# Patient Record
Sex: Male | Born: 1995 | Race: Black or African American | Hispanic: No | Marital: Single | Smoking: Current some day smoker
Health system: Southern US, Community
[De-identification: ages and names within clinical notes are randomized; demographics above are authoritative.]

## PROBLEM LIST (undated history)

## (undated) DIAGNOSIS — S42309A Unspecified fracture of shaft of humerus, unspecified arm, initial encounter for closed fracture: Secondary | ICD-10-CM

---

## 2016-10-02 ENCOUNTER — Emergency Department (HOSPITAL_COMMUNITY)
Admission: EM | Admit: 2016-10-02 | Discharge: 2016-10-03 | Disposition: A | Discharge status: Home / Self Care | Payer: Self-pay | Attending: Emergency Medicine | Admitting: Emergency Medicine

## 2016-10-02 ENCOUNTER — Emergency Department (HOSPITAL_COMMUNITY): Payer: Self-pay

## 2016-10-02 ENCOUNTER — Encounter (HOSPITAL_COMMUNITY): Payer: Self-pay

## 2016-10-02 DIAGNOSIS — S42292A Other displaced fracture of upper end of left humerus, initial encounter for closed fracture: Secondary | ICD-10-CM | POA: Insufficient documentation

## 2016-10-02 DIAGNOSIS — Y9289 Other specified places as the place of occurrence of the external cause: Secondary | ICD-10-CM | POA: Insufficient documentation

## 2016-10-02 DIAGNOSIS — Y9302 Activity, running: Secondary | ICD-10-CM | POA: Insufficient documentation

## 2016-10-02 DIAGNOSIS — W0110XA Fall on same level from slipping, tripping and stumbling with subsequent striking against unspecified object, initial encounter: Secondary | ICD-10-CM | POA: Insufficient documentation

## 2016-10-02 DIAGNOSIS — Y999 Unspecified external cause status: Secondary | ICD-10-CM | POA: Insufficient documentation

## 2016-10-02 MED ORDER — OXYCODONE-ACETAMINOPHEN 5-325 MG PO TABS
1.0000 | ORAL_TABLET | Freq: Once | ORAL | Status: AC
  Administered 2016-10-02: 1 via ORAL
  Filled 2016-10-02: qty 1

## 2016-10-02 NOTE — ED Triage Notes (Signed)
Pt reports he was running, tripped over his flip flops, fell and landed on concrete. He fell forward and landed on his left arm. He reports pain to left shoulder and has abrasion to left lateral forearm and upper lip. No LOC.

## 2016-10-02 NOTE — ED Provider Notes (Signed)
[facility]-EMERGENCY DEPT Provider Note   CSN: [id number] Arrival date & time: 10/02/16  2204     History   Chief Complaint Chief Complaint  Patient presents with  . Shoulder Injury    HPI Edgar Santiago is a 21 y.o. male.  HPI   Patient is a 21 year old male with no pertinent past medical history presents the ED status post mechanical fall that occurred earlier this evening. Patient reports he was running while he was "playing around" and states he tripped over his flip flops resulting in him landing on his left shoulder on the concrete ground. He reports scraping his upper lip on the ground as he fell, denies LOC. Endorses associated pain to his left shoulder and upper arm which is significantly worsened with any movement. Denies headache, dizziness, neck pain, back pain, chest pain, difficulty breathing, leg pain, numbness, weakness. Denies taking any medications prior to arrival.  History reviewed. No pertinent past medical history.  There are no active problems to display for this patient.   History reviewed. No pertinent surgical history.     Home Medications    Prior to Admission medications   Medication Sig Start Date End Date Taking? Authorizing Provider  ibuprofen (ADVIL,MOTRIN) 600 MG tablet Take 1 tablet (600 mg total) by mouth every 6 (six) hours as needed. 10/03/16   Barrett HenleNadeau, Breia Ocampo Elizabeth, PA-C  oxyCODONE-acetaminophen (PERCOCET/ROXICET) 5-325 MG tablet Take 1-2 tablets by mouth every 4 (four) hours as needed for severe pain. 10/03/16   Barrett HenleNadeau, Mirielle Byrum Elizabeth, PA-C    Family History No family history on file.  Social History Social History  Substance Use Topics  . Smoking status: Never Smoker  . Smokeless tobacco: Never Used  . Alcohol use Yes     Allergies   Patient has no allergy information on record.   Review of Systems Review of Systems  Musculoskeletal: Positive for arthralgias (left shoulder).  Skin: Positive for wound (lip abrasion).  All  other systems reviewed and are negative.    Physical Exam Updated Vital Signs BP (!) 142/96 (BP Location: Right Arm)   Pulse 76   Temp 99.1 F (37.3 C) (Oral)   Resp 16   SpO2 100%   Physical Exam  Constitutional: He is oriented to person, place, and time. He appears well-developed and well-nourished. No distress.  HENT:  Head: Normocephalic. Head is with abrasion. Head is without raccoon's eyes, without Battle's sign and without laceration.    Right Ear: Tympanic membrane normal. No hemotympanum.  Left Ear: Tympanic membrane normal. No hemotympanum.  Nose: Nose normal. No sinus tenderness, nasal deformity, septal deviation or nasal septal hematoma. No epistaxis.  Mouth/Throat: Uvula is midline, oropharynx is clear and moist and mucous membranes are normal. No oropharyngeal exudate, posterior oropharyngeal edema, posterior oropharyngeal erythema or tonsillar abscesses.  Eyes: Conjunctivae and EOM are normal. Pupils are equal, round, and reactive to light. Right eye exhibits no discharge. Left eye exhibits no discharge. No scleral icterus.  Neck: Normal range of motion. Neck supple.  Cardiovascular: Normal rate, regular rhythm, normal heart sounds and intact distal pulses.   Pulmonary/Chest: Effort normal and breath sounds normal. No respiratory distress. He has no wheezes. He has no rales. He exhibits no tenderness.  Abdominal: Soft. Bowel sounds are normal. He exhibits no distension and no mass. There is no tenderness. There is no rebound and no guarding. No hernia.  Musculoskeletal: He exhibits tenderness and deformity. He exhibits no edema.       Left shoulder: He exhibits  decreased range of motion, tenderness, bony tenderness and deformity. He exhibits no effusion, no crepitus, no laceration and normal pulse.  Deformity present to left shoulder with TTP over anterior and posterior shoulder and proximal humerus. Dec. ROM of left shoulder and elbow due to reports pain, FROM of left  forearm, wrist and hand with 5/5 strength.   No cervical, thoracic, or lumbar spine midline TTP. Full ROM of right upper and lower extremities with 5/5 strength.   2+ radial and PT pulses. Sensation grossly intact.   Neurological: He is alert and oriented to person, place, and time. He has normal strength. No cranial nerve deficit or sensory deficit. Coordination and gait normal.  Skin: Skin is warm and dry. He is not diaphoretic.  Nursing note and vitals reviewed.    ED Treatments / Results  Labs (all labs ordered are listed, but only abnormal results are displayed) Labs Reviewed - No data to display  EKG  EKG Interpretation None       Radiology Ct Shoulder Left Wo Contrast  Result Date: 10/03/2016 CLINICAL DATA:  Patient fell landing on concrete. Left shoulder pain. EXAM: CT OF THE UPPER LEFT EXTREMITY WITHOUT CONTRAST TECHNIQUE: Multidetector CT imaging of the upper left extremity was performed according to the standard protocol. COMPARISON:  10/02/2016 radiographs of the left shoulder. FINDINGS: Bones/Joint/Cartilage Acute, closed, comminuted fracture involving the surgical neck of the left humerus with nearly 1 shaft with posterior displacement and volar angulation of the distal fracture fragment. No malalignment of the glenohumeral or AC joints. Ligaments Suboptimally assessed by CT. Muscles and Tendons No intramuscular hemorrhage. Soft tissues Soft tissue edema overlies the fracture. The adjacent ribs and lung are unremarkable. IMPRESSION: Comminuted, displaced and volar angulated closed fracture of the proximal left humerus involving the surgical neck with greater than 1 cm displacement consistent with a Neer category 2 part fracture. No shoulder dislocation. AC joint is maintained. Electronically Signed   By: Tollie Eth M.D.   On: 10/03/2016 00:24   Dg Shoulder Left  Result Date: 10/02/2016 CLINICAL DATA:  Trip and fall injury while running tonight EXAM: LEFT SHOULDER - 2+ VIEW  COMPARISON:  None. FINDINGS: There is a mildly comminuted proximal humeral fracture full shaft width posterior displacement. No dislocation. IMPRESSION: Displaced proximal left humeral fracture. Electronically Signed   By: Ellery Plunk M.D.   On: 10/02/2016 22:45    Procedures Procedures (including critical care time)  Medications Ordered in ED Medications  oxyCODONE-acetaminophen (PERCOCET/ROXICET) 5-325 MG per tablet 1 tablet (1 tablet Oral Given 10/02/16 2302)  oxyCODONE-acetaminophen (PERCOCET/ROXICET) 5-325 MG per tablet 1 tablet (1 tablet Oral Given 10/03/16 0048)     Initial Impression / Assessment and Plan / ED Course  I have reviewed the triage vital signs and the nursing notes.  Pertinent labs & imaging results that were available during my care of the patient were reviewed by me and considered in my medical decision making (see chart for details).     Patient presents with left shoulder pain after tripping while he was running and landing on his left shoulder. VSS. Exam revealed deformity to left shoulder with swelling and diffuse tenderness and decreased range of motion due to pain. Left upper extremity otherwise neurovascularly intact. No evidence of head injury. Patient given pain meds in the ED. Left shoulder x-ray revealed displaced proximal left humeral fracture. Due to significant displacement on x-ray, consulted ortho. Dr. Aundria Rud advised to order CT, place pt in sling and follow up next week in  their office. Discussed results and plan with patient. Patient discharged home with pain meds and symptomatically treatment. Discussed return precautions.  Final Clinical Impressions(s) / ED Diagnoses   Final diagnoses:  Other closed displaced fracture of proximal end of left humerus, initial encounter    New Prescriptions Discharge Medication List as of 10/03/2016 12:44 AM    START taking these medications   Details  ibuprofen (ADVIL,MOTRIN) 600 MG tablet Take 1 tablet (600  mg total) by mouth every 6 (six) hours as needed., Starting Sat 10/03/2016, Print    oxyCODONE-acetaminophen (PERCOCET/ROXICET) 5-325 MG tablet Take 1-2 tablets by mouth every 4 (four) hours as needed for severe pain., Starting Sat 10/03/2016, Print         Barrett Henle, [place] 10/03/16 4098    Ward, Layla Maw, DO 10/03/16 [phone]

## 2016-10-03 ENCOUNTER — Emergency Department (HOSPITAL_COMMUNITY): Payer: Self-pay

## 2016-10-03 MED ORDER — IBUPROFEN 600 MG PO TABS
600.0000 mg | ORAL_TABLET | Freq: Four times a day (QID) | ORAL | 0 refills | Status: AC | PRN
Start: 2016-10-03 — End: ?

## 2016-10-03 MED ORDER — OXYCODONE-ACETAMINOPHEN 5-325 MG PO TABS
1.0000 | ORAL_TABLET | Freq: Once | ORAL | Status: AC
  Administered 2016-10-03: 1 via ORAL
  Filled 2016-10-03: qty 1

## 2016-10-03 MED ORDER — OXYCODONE-ACETAMINOPHEN 5-325 MG PO TABS: 1.0000 | ORAL_TABLET | ORAL | 0 refills | Status: AC | PRN

## 2016-10-03 NOTE — Discharge Instructions (Signed)
Take your pain medications as prescribed as needed for pain relief. I also recommend applying ice to affected area for 15-20 minutes 3-4 times daily. Continue wearing your arm sling all day (except you may remove for bathing) until you follow up with orthopedics.  Call the orthopedic office listed below on Monday morning to schedule a follow-up appointment for reevaluation next week for further management of her humerus fracture. Please return to the Emergency Department if symptoms worsen or new onset of fever, redness, swelling, numbness, weakness, decreased range of motion.

## 2016-10-03 NOTE — ED Notes (Signed)
Patient transported to CT 

## 2016-10-14 ENCOUNTER — Encounter (HOSPITAL_COMMUNITY): Payer: Self-pay | Admitting: *Deleted

## 2016-10-14 NOTE — Progress Notes (Signed)
Pt denies SOB, chest pain, and being under the care of a cardiologist. Pt denies having a stress test, echo and cardiac cath. Pt denies having and EKG and chest x ray within the last year. Pt denies having recent labs. Pt made  aware to stop taking  Aspirin,vitamins, fish oil and herbal medications. Do not take any NSAIDs ie: Ibuprofen, Advil, Naproxen, BC and Goody Powder or any medication containing Aspirin. Pt verbalized understanding of all pre-op instructions.

## 2016-10-15 ENCOUNTER — Ambulatory Visit (HOSPITAL_COMMUNITY): Payer: BLUE CROSS/BLUE SHIELD

## 2016-10-15 ENCOUNTER — Ambulatory Visit (HOSPITAL_COMMUNITY)
Admission: RE | Admit: 2016-10-15 | Discharge: 2016-10-15 | Disposition: A | Discharge status: Home / Self Care | Payer: BLUE CROSS/BLUE SHIELD | Source: Ambulatory Visit | Attending: Orthopedic Surgery | Admitting: Orthopedic Surgery

## 2016-10-15 ENCOUNTER — Ambulatory Visit (HOSPITAL_COMMUNITY): Payer: BLUE CROSS/BLUE SHIELD | Admitting: Anesthesiology

## 2016-10-15 ENCOUNTER — Encounter (HOSPITAL_COMMUNITY): Payer: Self-pay | Admitting: Urology

## 2016-10-15 ENCOUNTER — Encounter (HOSPITAL_COMMUNITY)
Admission: RE | Disposition: A | Discharge status: Home / Self Care | Payer: Self-pay | Source: Ambulatory Visit | Attending: Orthopedic Surgery

## 2016-10-15 DIAGNOSIS — Z419 Encounter for procedure for purposes other than remedying health state, unspecified: Secondary | ICD-10-CM

## 2016-10-15 DIAGNOSIS — F17210 Nicotine dependence, cigarettes, uncomplicated: Secondary | ICD-10-CM | POA: Insufficient documentation

## 2016-10-15 DIAGNOSIS — W1839XA Other fall on same level, initial encounter: Secondary | ICD-10-CM | POA: Insufficient documentation

## 2016-10-15 DIAGNOSIS — S42265A Nondisplaced fracture of lesser tuberosity of left humerus, initial encounter for closed fracture: Secondary | ICD-10-CM | POA: Diagnosis not present

## 2016-10-15 DIAGNOSIS — S42202A Unspecified fracture of upper end of left humerus, initial encounter for closed fracture: Secondary | ICD-10-CM | POA: Diagnosis present

## 2016-10-15 HISTORY — PX: ORIF HUMERUS FRACTURE: SHX2126

## 2016-10-15 HISTORY — DX: Unspecified fracture of shaft of humerus, unspecified arm, initial encounter for closed fracture: S42.309A

## 2016-10-15 SURGERY — OPEN REDUCTION INTERNAL FIXATION (ORIF) PROXIMAL HUMERUS FRACTURE
Anesthesia: Regional | Laterality: Left

## 2016-10-15 MED ORDER — 0.9 % SODIUM CHLORIDE (POUR BTL) OPTIME
TOPICAL | Status: DC | PRN
  Administered 2016-10-15: 1000 mL

## 2016-10-15 MED ORDER — ROCURONIUM BROMIDE 100 MG/10ML IV SOLN
INTRAVENOUS | Status: DC | PRN
  Administered 2016-10-15 (×2): 50 mg via INTRAVENOUS

## 2016-10-15 MED ORDER — KETOROLAC TROMETHAMINE 30 MG/ML IJ SOLN
INTRAMUSCULAR | Status: DC | PRN
  Administered 2016-10-15: 30 mg via INTRAVENOUS

## 2016-10-15 MED ORDER — ONDANSETRON HCL 4 MG/2ML IJ SOLN
INTRAMUSCULAR | Status: AC
  Filled 2016-10-15: qty 2

## 2016-10-15 MED ORDER — SUGAMMADEX SODIUM 200 MG/2ML IV SOLN
INTRAVENOUS | Status: AC
  Filled 2016-10-15: qty 2

## 2016-10-15 MED ORDER — CHLORHEXIDINE GLUCONATE 4 % EX LIQD: 60.0000 mL | Freq: Once | CUTANEOUS | Status: DC

## 2016-10-15 MED ORDER — OXYCODONE HCL 5 MG PO TABS: 5.0000 mg | ORAL_TABLET | Freq: Once | ORAL | Status: DC | PRN

## 2016-10-15 MED ORDER — PROPOFOL 10 MG/ML IV BOLUS
INTRAVENOUS | Status: DC | PRN
Start: 2016-10-15 — End: 2016-10-15
  Administered 2016-10-15: 180 mg via INTRAVENOUS

## 2016-10-15 MED ORDER — BUPIVACAINE-EPINEPHRINE (PF) 0.5% -1:200000 IJ SOLN
INTRAMUSCULAR | Status: DC | PRN
  Administered 2016-10-15: 30 mL via PERINEURAL

## 2016-10-15 MED ORDER — ASPIRIN EC 325 MG PO TBEC: 325.0000 mg | DELAYED_RELEASE_TABLET | Freq: Every day | ORAL | 0 refills | Status: AC

## 2016-10-15 MED ORDER — ONDANSETRON 4 MG PO TBDP: 4.0000 mg | ORAL_TABLET | Freq: Three times a day (TID) | ORAL | 0 refills | Status: AC | PRN

## 2016-10-15 MED ORDER — PROPOFOL 10 MG/ML IV BOLUS
INTRAVENOUS | Status: AC
  Filled 2016-10-15: qty 20

## 2016-10-15 MED ORDER — METHOCARBAMOL 500 MG PO TABS: 500.0000 mg | ORAL_TABLET | Freq: Four times a day (QID) | ORAL | 0 refills | Status: AC | PRN

## 2016-10-15 MED ORDER — EPHEDRINE 5 MG/ML INJ
INTRAVENOUS | Status: AC
  Filled 2016-10-15: qty 10

## 2016-10-15 MED ORDER — LIDOCAINE 2% (20 MG/ML) 5 ML SYRINGE
INTRAMUSCULAR | Status: AC
  Filled 2016-10-15: qty 5

## 2016-10-15 MED ORDER — HYDROMORPHONE HCL 1 MG/ML IJ SOLN: 0.2500 mg | INTRAMUSCULAR | Status: DC | PRN

## 2016-10-15 MED ORDER — FENTANYL CITRATE (PF) 100 MCG/2ML IJ SOLN
INTRAMUSCULAR | Status: DC | PRN
  Administered 2016-10-15: 100 ug via INTRAVENOUS
  Administered 2016-10-15: 50 ug via INTRAVENOUS

## 2016-10-15 MED ORDER — ROCURONIUM BROMIDE 10 MG/ML (PF) SYRINGE
PREFILLED_SYRINGE | INTRAVENOUS | Status: AC
  Filled 2016-10-15: qty 10

## 2016-10-15 MED ORDER — DEXTROSE 5 % IV SOLN
INTRAVENOUS | Status: DC | PRN
  Administered 2016-10-15: 15 ug/min via INTRAVENOUS

## 2016-10-15 MED ORDER — KETOROLAC TROMETHAMINE 30 MG/ML IJ SOLN
INTRAMUSCULAR | Status: AC
  Filled 2016-10-15: qty 1

## 2016-10-15 MED ORDER — OXYCODONE HCL 5 MG/5ML PO SOLN: 5.0000 mg | Freq: Once | ORAL | Status: DC | PRN

## 2016-10-15 MED ORDER — LACTATED RINGERS IV SOLN
INTRAVENOUS | Status: DC | PRN
  Administered 2016-10-15: 07:00:00 via INTRAVENOUS

## 2016-10-15 MED ORDER — MIDAZOLAM HCL 2 MG/2ML IJ SOLN
INTRAMUSCULAR | Status: AC
  Filled 2016-10-15: qty 2

## 2016-10-15 MED ORDER — CEFAZOLIN SODIUM-DEXTROSE 2-4 GM/100ML-% IV SOLN
2.0000 g | INTRAVENOUS | Status: AC
  Administered 2016-10-15: 2 g via INTRAVENOUS

## 2016-10-15 MED ORDER — MIDAZOLAM HCL 5 MG/5ML IJ SOLN
INTRAMUSCULAR | Status: DC | PRN
  Administered 2016-10-15: 2 mg via INTRAVENOUS

## 2016-10-15 MED ORDER — OXYCODONE HCL 5 MG PO TABS: 5.0000 mg | ORAL_TABLET | ORAL | 0 refills | Status: AC | PRN

## 2016-10-15 MED ORDER — CEFAZOLIN SODIUM-DEXTROSE 2-4 GM/100ML-% IV SOLN
INTRAVENOUS | Status: AC
  Filled 2016-10-15: qty 100

## 2016-10-15 MED ORDER — FENTANYL CITRATE (PF) 250 MCG/5ML IJ SOLN
INTRAMUSCULAR | Status: AC
  Filled 2016-10-15: qty 5

## 2016-10-15 MED ORDER — EPHEDRINE SULFATE-NACL 50-0.9 MG/10ML-% IV SOSY
PREFILLED_SYRINGE | INTRAVENOUS | Status: DC | PRN
  Administered 2016-10-15 (×2): 5 mg via INTRAVENOUS

## 2016-10-15 MED ORDER — LIDOCAINE HCL (CARDIAC) 20 MG/ML IV SOLN
INTRAVENOUS | Status: DC | PRN
  Administered 2016-10-15: 60 mg via INTRAVENOUS

## 2016-10-15 MED ORDER — SUGAMMADEX SODIUM 200 MG/2ML IV SOLN
INTRAVENOUS | Status: DC | PRN
  Administered 2016-10-15: 200 mg via INTRAVENOUS

## 2016-10-15 SURGICAL SUPPLY — 83 items
BENZOIN TINCTURE PRP APPL 2/3 (GAUZE/BANDAGES/DRESSINGS) IMPLANT
BIT DRILL 3.2 (BIT) ×2
BIT DRILL 3.2XCALB NS DISP (BIT) ×1 IMPLANT
BIT DRILL CALIBRATED 2.7 (BIT) ×2 IMPLANT
BIT DRILL CALIBRATED 2.7MM (BIT) ×1
BIT DRL 3.2XCALB NS DISP (BIT) ×1
BLADE CLIPPER SURG (BLADE) IMPLANT
CLOSURE STERI-STRIP 1/2X4 (GAUZE/BANDAGES/DRESSINGS) ×1
CLOSURE WOUND 1/2 X4 (GAUZE/BANDAGES/DRESSINGS)
CLSR STERI-STRIP ANTIMIC 1/2X4 (GAUZE/BANDAGES/DRESSINGS) ×2 IMPLANT
COVER SURGICAL LIGHT HANDLE (MISCELLANEOUS) ×3 IMPLANT
DECANTER SPIKE VIAL GLASS SM (MISCELLANEOUS) IMPLANT
DRAPE IMP U-DRAPE 54X76 (DRAPES) ×3 IMPLANT
DRAPE U-SHAPE 47X51 STRL (DRAPES) ×3 IMPLANT
DRSG ADAPTIC 3X8 NADH LF (GAUZE/BANDAGES/DRESSINGS) ×3 IMPLANT
DRSG MEPILEX BORDER 4X8 (GAUZE/BANDAGES/DRESSINGS) ×3 IMPLANT
DRSG PAD ABDOMINAL 8X10 ST (GAUZE/BANDAGES/DRESSINGS) ×6 IMPLANT
DURAPREP 26ML APPLICATOR (WOUND CARE) ×3 IMPLANT
ELECT CAUTERY BLADE 6.4 (BLADE) ×3 IMPLANT
ELECT REM PT RETURN 9FT ADLT (ELECTROSURGICAL) ×3
ELECTRODE REM PT RTRN 9FT ADLT (ELECTROSURGICAL) ×1 IMPLANT
EVACUATOR 1/8 PVC DRAIN (DRAIN) IMPLANT
GAUZE SPONGE 4X4 12PLY STRL (GAUZE/BANDAGES/DRESSINGS) ×3 IMPLANT
GLOVE BIO SURGEON STRL SZ7.5 (GLOVE) ×3 IMPLANT
GLOVE BIO SURGEON STRL SZ8 (GLOVE) ×12 IMPLANT
GLOVE BIOGEL PI IND STRL 8 (GLOVE) ×1 IMPLANT
GLOVE BIOGEL PI INDICATOR 8 (GLOVE) ×2
GLOVE SURG SS PI 7.5 STRL IVOR (GLOVE) ×3 IMPLANT
GOWN STRL REUS W/ TWL LRG LVL3 (GOWN DISPOSABLE) ×1 IMPLANT
GOWN STRL REUS W/ TWL XL LVL3 (GOWN DISPOSABLE) ×3 IMPLANT
GOWN STRL REUS W/TWL LRG LVL3 (GOWN DISPOSABLE) ×2
GOWN STRL REUS W/TWL XL LVL3 (GOWN DISPOSABLE) ×6
K-WIRE 2X5 SS THRDED S3 (WIRE) ×6
KIT BASIN OR (CUSTOM PROCEDURE TRAY) ×3 IMPLANT
KIT ROOM TURNOVER OR (KITS) ×3 IMPLANT
KWIRE 2X5 SS THRDED S3 (WIRE) ×2 IMPLANT
MANIFOLD NEPTUNE II (INSTRUMENTS) ×3 IMPLANT
NDL SUT 6 .5 CRC .975X.05 MAYO (NEEDLE) IMPLANT
NEEDLE 22X1 1/2 (OR ONLY) (NEEDLE) ×3 IMPLANT
NEEDLE MAYO TAPER (NEEDLE)
NS IRRIG 1000ML POUR BTL (IV SOLUTION) ×3 IMPLANT
PACK SHOULDER (CUSTOM PROCEDURE TRAY) ×3 IMPLANT
PACK UNIVERSAL I (CUSTOM PROCEDURE TRAY) IMPLANT
PAD ARMBOARD 7.5X6 YLW CONV (MISCELLANEOUS) ×6 IMPLANT
PASSER SUT SWANSON 36MM LOOP (INSTRUMENTS) ×3 IMPLANT
PEG LOCKING 3.2MMX26MM (Peg) ×3 IMPLANT
PEG LOCKING 3.2MMX46 (Peg) ×3 IMPLANT
PEG LOCKING 3.2MMX54 (Peg) ×3 IMPLANT
PEG LOCKING 3.2MMX56 (Peg) ×3 IMPLANT
PEG LOCKING 3.2MMX65 (Peg) ×3 IMPLANT
PEG LOCKING 3.2X36 (Screw) ×3 IMPLANT
PEG LOCKING 3.2X40 (Peg) ×3 IMPLANT
PLATE HUMERUS LP PROX L 3H (Plate) ×3 IMPLANT
SCREW LOW PROF TIS 3.5X28MM (Screw) ×6 IMPLANT
SCREW LOW PROFILE 3.5X30MM TIS (Screw) ×3 IMPLANT
SCREW T15 LP CORT 3.5X40MM NS (Screw) ×3 IMPLANT
SLEEVE MEASURING 3.2 (BIT) ×3 IMPLANT
SLING ARM IMMOBILIZER LRG (SOFTGOODS) ×3 IMPLANT
SLING ARM IMMOBILIZER MED (SOFTGOODS) IMPLANT
SPONGE LAP 18X18 X RAY DECT (DISPOSABLE) IMPLANT
SPONGE LAP 4X18 X RAY DECT (DISPOSABLE) ×3 IMPLANT
STAPLER VISISTAT 35W (STAPLE) IMPLANT
STRIP CLOSURE SKIN 1/2X4 (GAUZE/BANDAGES/DRESSINGS) IMPLANT
SUCTION FRAZIER HANDLE 10FR (MISCELLANEOUS) ×2
SUCTION TUBE FRAZIER 10FR DISP (MISCELLANEOUS) ×1 IMPLANT
SUPPORT WRAP ARM LG (MISCELLANEOUS) ×3 IMPLANT
SUT ETHIBOND NAB CT1 #1 30IN (SUTURE) ×6 IMPLANT
SUT FIBERWIRE #2 38 T-5 BLUE (SUTURE) ×6
SUT MAXBRAID (SUTURE) ×3 IMPLANT
SUT MNCRL AB 3-0 PS2 18 (SUTURE) ×3 IMPLANT
SUT MON AB 2-0 CT1 36 (SUTURE) ×6 IMPLANT
SUT VIC AB 0 CT1 27 (SUTURE) ×2
SUT VIC AB 0 CT1 27XBRD ANBCTR (SUTURE) ×1 IMPLANT
SUT VIC AB 1 CT1 27 (SUTURE) ×2
SUT VIC AB 1 CT1 27XBRD ANBCTR (SUTURE) ×1 IMPLANT
SUT VIC AB 2-0 CT1 27 (SUTURE) ×2
SUT VIC AB 2-0 CT1 TAPERPNT 27 (SUTURE) ×1 IMPLANT
SUTURE FIBERWR #2 38 T-5 BLUE (SUTURE) ×2 IMPLANT
SYR CONTROL 10ML LL (SYRINGE) ×3 IMPLANT
TOWEL OR 17X24 6PK STRL BLUE (TOWEL DISPOSABLE) ×3 IMPLANT
TOWEL OR 17X26 10 PK STRL BLUE (TOWEL DISPOSABLE) ×3 IMPLANT
TRAY FOLEY W/METER SILVER 16FR (SET/KITS/TRAYS/PACK) IMPLANT
WATER STERILE IRR 1000ML POUR (IV SOLUTION) IMPLANT

## 2016-10-15 NOTE — Op Note (Signed)
10/15/2016  10:03 AM  PATIENT:  Edgar Santiago    PRE-OPERATIVE DIAGNOSIS: Closed 2 part Left proximal humerus fracture  POST-OPERATIVE DIAGNOSIS:  Same  PROCEDURE: 1. OPEN REDUCTION INTERNAL FIXATION (ORIF) PROXIMAL HUMERUS FRACTURE  2.  Long head of biceps transfer to pectoralis major tendon.   SURGEON:  Yolonda Kida, MD  ASSISTANT: April Green, RNFA.  ANESTHESIA:   General with a regional block.  Blood loss: 200 mL  Complications: None  PREOPERATIVE INDICATIONS:  Edgar Santiago is a right-hand dominant college student who is a  21 y.o. male with a diagnosis of Left proximal humerus fracture who elected for surgical management.   He sustained this injury following a ground level fall onto concrete while roughhousing with his roommate. We obtained a CT scan showed that he had about 200% posterior displacement of the shaft fragment as well as superior migration. There was also a nondisplaced fracture to the lesser tuberosity. Due to the displaced nature of this fracture as well as his young age we recommended operative fixation.  The risks benefits and alternatives were discussed with the patient including but not limited to the risks of nonoperative treatment, versus surgical intervention including infection, bleeding, nerve injury, malunion, nonunion, the need for revision surgery, hardware prominence, hardware failure, the need for hardware removal, blood clots, cardiopulmonary complications, conversion to arthroplasty, morbidity, mortality, among others, and they were willing to proceed.  Predicted outcome is good, although there will be at least a six to nine month expected recovery.  The patient did provide informed consent for the procedure.  OPERATIVE IMPLANTS: Biomet ALPS proximal humerus locking plate.  OPERATIVE FINDINGS: Displaced proximal humerus fracture.  UNIQUE ASPECTS OF THE CASE:  The shaft fragment had migrated superiorly such that the level of the fracture of  the shaft was equal to the humeral head height. This required fairly aggressive releasing of soft tissue callus as well as the upper one third of the pectoralis major tendon. During the reduction maneuver it was noted as well as the long head of the biceps tendon with tethering of the shaft fragment from being allowed to reduce. Subsequently long head biceps tenodesis was undergone to facilitate mobilizing the shaft fragment.  OPERATIVE PROCEDURE: The patient was brought to the operating room and placed in the supine position. Preoperative timeout was performed prior to starting the procedure in accordance with the AAO S guidelines. The operative extremity had been marked prior to entering the room in the preoperative holding area. General anesthesia was administered. IV antibiotics were given. He was placed in the beach chair position. All bony prominences were padded. The upper extremity was prepped and draped in usual sterile fashion. Deltopectoral incision was performed.  I exposed the fracture site, and placed deep retractors. I elevated a small portion of the deltoid off of the shaft, in order to gain access for the plate. I also released the upper one third of the pectoralis major tendon to facilitate reduction. At this point it was noted that the long head of the biceps tendon was tethering the fracture fragment. The reduction was not able to be performed subsequent to this. I then proceeded to tenotomized the biceps long head tendon just distal to the transverse humeral ligament.  I then performed a soft tissue transfer tenodesis of the long head of biceps to the pectoralis major tendon. This was secured with 2 permanent nonabsorbable figure-of-eight stitches. I placed supraspinatus and subscapularis stitches, and then reduced the head onto the shaft. This was  maintained in satisfactory position.  I applied the plate and secured it into the sliding hole first. I confirmed position of the reduction and  the plate with C-arm, and I placed a total of 2 guidewires into the appropriate position in the head. I was satisfied that the plate was distal appropriately, and then secured the plate proximally with smooth pegs, taking care to prevent penetration into the arch articular surface, using C-arm, as well as manual feel using a hand drill.  I then secured the plate distally using 2 more cortical screws. I then passed the FiberWire sutures from the subscapularis and supraspinatus through the plate and secured the tuberosities. Once complete fixation and reduction of been achieved, took final C-arm pictures, and irrigated the wounds copiously, the deep subcu layer was closed with 2-0 Monocryl and for the subcutaneous tissue with 3-0 Monocryl and Steri-Strips for the skin. He was placed in a sling. He had a preoperative regional block as well. He tolerated the procedure well with no complications.   Disposition:  Patient will be nonweightbearing to the left upper extremity for approximately the next 4 weeks. We will allow pendulums immediately as well as hand wrist and elbow range of motion. He will begin physical therapy in 2 weeks from surgery working on passive range of motion at the shoulder. I will see him back in the office at 2 weeks as well for a wound check. We will discharge him home from PACU with oral narcotic medication as well as nausea medication. We will have him use once daily aspirin for 2 weeks for DVT prophylaxis as well as early mobilization.

## 2016-10-15 NOTE — Transfer of Care (Signed)
Immediate Anesthesia Transfer of Care Note  Patient: Edgar Santiago  Procedure(s) Performed: Procedure(s) with comments: OPEN REDUCTION INTERNAL FIXATION (ORIF) PROXIMAL HUMERUS FRACTURE (Left) - 150 mins  Patient Location: PACU  Anesthesia Type:GA combined with regional for post-op pain  Level of Consciousness: awake, alert  and oriented  Airway & Oxygen Therapy: Patient Spontanous Breathing and Patient connected to nasal cannula oxygen  Post-op Assessment: Report given to RN, Post -op Vital signs reviewed and stable and Patient moving all extremities  Post vital signs: Reviewed and stable  Last Vitals:  Vitals:   10/15/16 0612 10/15/16 1000  BP: 132/66   Pulse: 78   Resp: 16   Temp: 37.1 C (P) 36.6 C    Last Pain:  Vitals:   10/15/16 1000  PainSc: (P) 0-No pain      Patients Stated Pain Goal: 3 (10/15/16 0636)  Complications: No apparent anesthesia complications

## 2016-10-15 NOTE — Anesthesia Procedure Notes (Signed)
Anesthesia Regional Block: Interscalene brachial plexus block   Pre-Anesthetic Checklist: ,, timeout performed, Correct Patient, Correct Site, Correct Laterality, Correct Procedure, Correct Position, site marked, Risks and benefits discussed,  Surgical consent,  Pre-op evaluation,  At surgeon's request and post-op pain management  Laterality: Left  Prep: chloraprep       Needles:  Injection technique: Single-shot  Needle Type: Echogenic Stimulator Needle     Needle Length: 5cm  Needle Gauge: 22     Additional Needles:   Procedures: ultrasound guided,,,,,,,,  Narrative:  Start time: 10/15/2016 7:16 AM End time: 10/15/2016 7:22 AM Injection made incrementally with aspirations every 5 mL.  Performed by: Personally  Anesthesiologist: Nesta Scaturro  Additional Notes: Functioning IV was confirmed and monitors applied.  A 50mm 22ga echogenic arrow stimulator was used. Sterile prep and drape,hand hygiene and sterile gloves were used.Ultrasound guidance: relevant anatomy identified, needle position confirmed, local anesthetic spread visualized around nerve(s)., vascular puncture avoided.  Image printed for medical record.  Negative aspiration and negative test dose prior to incremental administration of local anesthetic. The patient tolerated the procedure well.

## 2016-10-15 NOTE — Anesthesia Procedure Notes (Signed)
Procedure Name: Intubation Date/Time: 10/15/2016 7:35 AM Performed by: Kyung Rudd Pre-anesthesia Checklist: Patient identified, Emergency Drugs available, Suction available and Patient being monitored Patient Re-evaluated:Patient Re-evaluated prior to inductionOxygen Delivery Method: Circle system utilized Preoxygenation: Pre-oxygenation with 100% oxygen Intubation Type: IV induction Ventilation: Mask ventilation without difficulty Laryngoscope Size: Mac and 4 Grade View: Grade I Tube type: Oral Tube size: 7.0 mm Number of attempts: 1 Airway Equipment and Method: Stylet Placement Confirmation: ETT inserted through vocal cords under direct vision,  positive ETCO2 and breath sounds checked- equal and bilateral Secured at: 21 cm Tube secured with: Tape Dental Injury: Teeth and Oropharynx as per pre-operative assessment

## 2016-10-15 NOTE — Anesthesia Preprocedure Evaluation (Addendum)
Anesthesia Evaluation  Patient identified by MRN, date of birth, ID band Patient awake    Reviewed: Allergy & Precautions, NPO status , Patient's Chart, lab work & pertinent test results  History of Anesthesia Complications Negative for: history of anesthetic complications  Airway Mallampati: I  TM Distance: >3 FB Neck ROM: Full    Dental  (+) Teeth Intact, Chipped,    Pulmonary Current Smoker,    breath sounds clear to auscultation       Cardiovascular negative cardio ROS   Rhythm:Regular     Neuro/Psych negative neurological ROS  negative psych ROS   GI/Hepatic negative GI ROS, Neg liver ROS,   Endo/Other  negative endocrine ROS  Renal/GU negative Renal ROS     Musculoskeletal   Abdominal   Peds  Hematology negative hematology ROS (+)   Anesthesia Other Findings   Reproductive/Obstetrics                            Anesthesia Physical Anesthesia Plan  ASA: I  Anesthesia Plan: General   Post-op Pain Management:  Regional for Post-op pain   Induction: Intravenous  Airway Management Planned: Oral ETT  Additional Equipment: None  Intra-op Plan:   Post-operative Plan: Extubation in OR  Informed Consent: I have reviewed the patients History and Physical, chart, labs and discussed the procedure including the risks, benefits and alternatives for the proposed anesthesia with the patient or authorized representative who has indicated his/her understanding and acceptance.   Dental advisory given  Plan Discussed with: Anesthesiologist, CRNA and Surgeon  Anesthesia Plan Comments:        Anesthesia Quick Evaluation

## 2016-10-15 NOTE — Discharge Instructions (Signed)
-  No weightbearing to the left upper extremity for 4-6 weeks. -Maintain postoperative dressing for 1 week and then you may remove and cover with a dry dressing once daily. -It is okay to shower starting tomorrow and leave the dressing in place until the one week initial change. -It is okay to remove the sling to do hand and wrist as well as elbow range of motion. No lifting over 1-2 pounds in the left hand. -Return to clinic in 2 weeks for a wound check. -For the prevention of blood clots take a single 325 mg aspirin once daily for 2 weeks.

## 2016-10-15 NOTE — H&P (Signed)
   ORTHOPAEDIC CONSULTATION  REQUESTING PHYSICIAN: [name], [name], MD  PCP:  Patient, No Pcp Per  Chief Complaint: Left proximal humerus fracture  HPI: Edgar Santiago is a 21 y.o. male who complains of left proximal humerus fracture sustained back on 10/03/16.  He was seen in my office last week and we discussed operative fixation due to the displaced nature of the fragments, and he is here today for that surgery.  He is RHD and a full time college student without medical co morbidities.  He denies numbness or paresthesias or distal radicular pain.  Past Medical History:  Diagnosis Date  . Humerus fracture    left proximal   History reviewed. No pertinent surgical history. Social History   Social History  . Marital status: Single    Spouse name: N/A  . Number of children: N/A  . Years of education: N/A   Social History Main Topics  . Smoking status: Current Some Day Smoker    Types: Cigarettes  . Smokeless tobacco: Never Used  . Alcohol use Yes     Comment: social  . Drug use: No  . Sexual activity: Not Asked   Other Topics Concern  . None   Social History Narrative  . None   History reviewed. No pertinent family history. No Known Allergies Prior to Admission medications   Medication Sig Start Date End Date Taking? Authorizing Provider  acetaminophen (TYLENOL) 500 MG tablet Take 500 mg by mouth every 6 (six) hours as needed.   Yes [provider]  ibuprofen (ADVIL,MOTRIN) 600 MG tablet Take 1 tablet (600 mg total) by mouth every 6 (six) hours as needed. 10/03/16  Yes Barrett HenleNadeau, Nicole Elizabeth, PA-C  oxyCODONE-acetaminophen (PERCOCET) 10-325 MG tablet Take 1 tablet by mouth every 6 (six) hours as needed for pain. 10/05/16  Yes [provider]  oxyCODONE-acetaminophen (PERCOCET/ROXICET) 5-325 MG tablet Take 1-2 tablets by mouth every 4 (four) hours as needed for severe pain. Patient not taking: Reported on 10/09/2016 10/03/16   Barrett HenleNadeau, Nicole Elizabeth,  PA-C   No results found.  Positive ROS: All other systems have been reviewed and were otherwise negative with the exception of those mentioned in the HPI and as above.  Physical Exam: General: Alert, no acute distress Cardiovascular: No pedal edema Respiratory: No cyanosis, no use of accessory musculature GI: No organomegaly, abdomen is soft and non-tender Skin: No lesions in the area of chief complaint Neurologic: Sensation intact distally Psychiatric: Patient is competent for consent with normal mood and affect Lymphatic: No axillary or cervical lymphadenopathy  MUSCULOSKELETAL:  LUE- TTP along proximal humerus, pain with pROM of shoulder, +SILT ax/mc/labc/r/m/u, +motor intact ax/mc/m/ain/r/pin/u. 2+ radial pulse  Assessment: Left proximal humerus fracture  Plan: -to OR today for ORIF of the left proximal humerus -The risks, benefits, and alternatives were discussed with the patient. There are risks associated with the surgery including, but not limited to, problems with anesthesia (death), infection, differences in length/angulation/rotation, fracture of bones, loosening or failure of implants, malunion, nonunion, hematoma (blood accumulation) which may require surgical drainage, blood clots, pulmonary embolism, nerve injury, and blood vessel injury. The patient understands these risks and elects to proceed.     Yolonda KidaJason Patrick Rogers, MD Cell [phone]    10/15/2016 7:14 AM

## 2016-10-16 ENCOUNTER — Encounter (HOSPITAL_COMMUNITY): Payer: Self-pay | Admitting: Orthopedic Surgery

## 2016-10-16 NOTE — Anesthesia Postprocedure Evaluation (Addendum)
Anesthesia Post Note  Patient: Archer Asayshon Storck  Procedure(s) Performed: Procedure(s) (LRB): OPEN REDUCTION INTERNAL FIXATION (ORIF) PROXIMAL HUMERUS FRACTURE (Left)  Patient location during evaluation: PACU Anesthesia Type: Regional Level of consciousness: awake and alert Pain management: pain level controlled Vital Signs Assessment: post-procedure vital signs reviewed and stable Respiratory status: spontaneous breathing, nonlabored ventilation, respiratory function stable and patient connected to nasal cannula oxygen Cardiovascular status: blood pressure returned to baseline and stable Postop Assessment: no signs of nausea or vomiting Anesthetic complications: no       Last Vitals:  Vitals:   10/15/16 1000 10/15/16 1030  BP:  (!) 139/97  Pulse:  88  Resp:  18  Temp: 36.6 C 36.8 C    Last Pain:  Vitals:   10/15/16 1030  PainSc: 0-No pain                 Akul Leggette

## 2016-11-02 NOTE — Addendum Note (Signed)
Addendum  created 11/02/16 1531 by Audrena Talaga, MD   Sign clinical note    

## 2018-08-06 IMAGING — CT CT SHOULDER*L* W/O CM
3 series · 14 of 33 positions shown, 17 images · non-contrast
Comparison: 10/02/2016 radiographs of the left shoulder.

CLINICAL DATA: Patient fell landing on concrete. Left shoulder
pain.

EXAM:
CT OF THE UPPER LEFT EXTREMITY WITHOUT CONTRAST
TECHNIQUE: Multidetector CT imaging of the upper left extremity was performed
according to the standard protocol.

[Series 3: (id) st ax · axial · 0.54mm/px · z∈[+714,+830]mm · 6 of 76 slices shown, 8 images]
[im 12/76  soft-tissue]
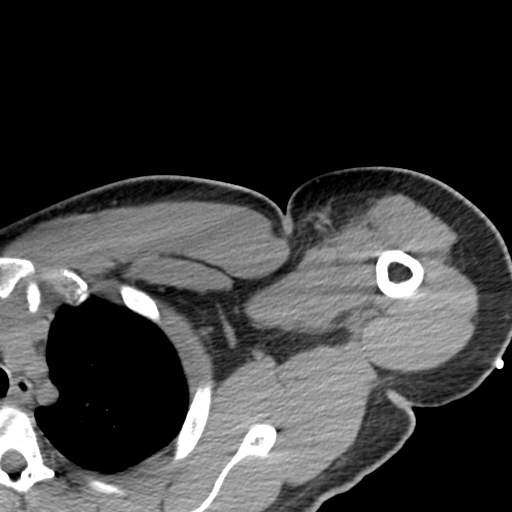
[im 12/76  bone]
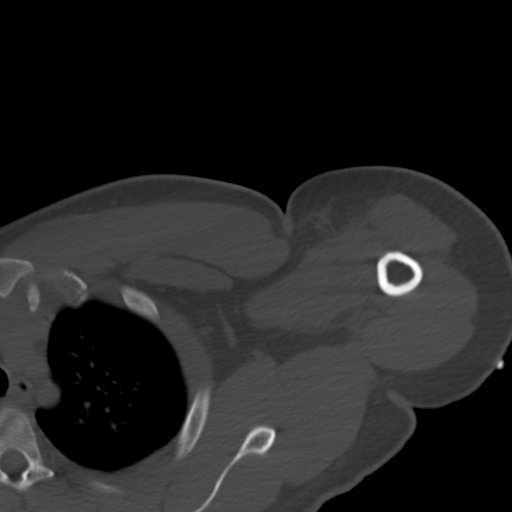
[im 24/76  bone]
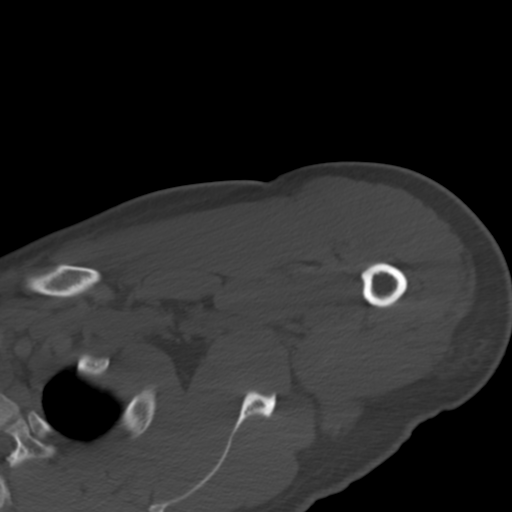
[im 35/76  bone]
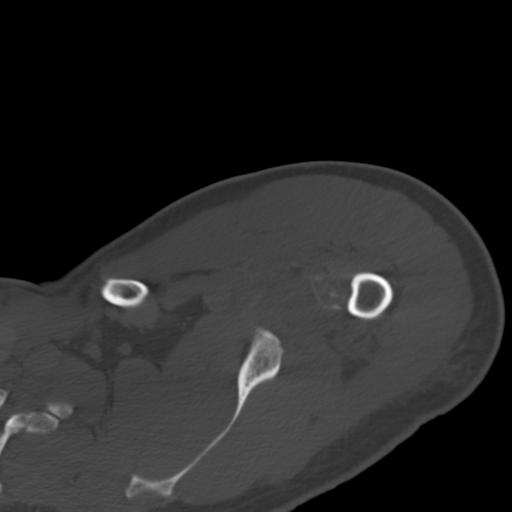
[im 47/76  bone]
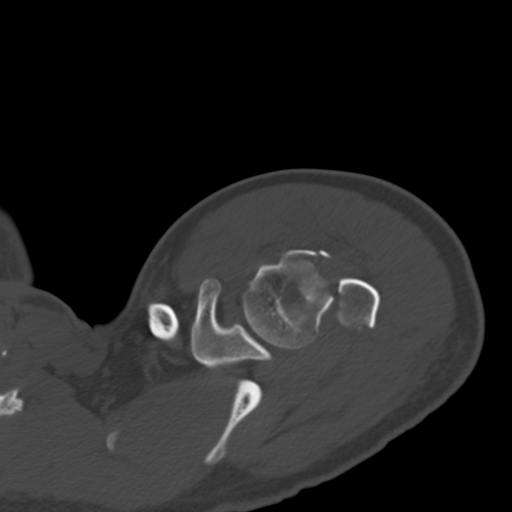
[im 58/76  soft-tissue]
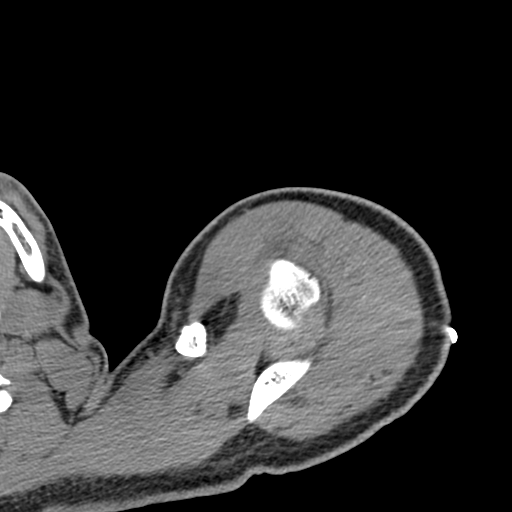
[im 58/76  bone]
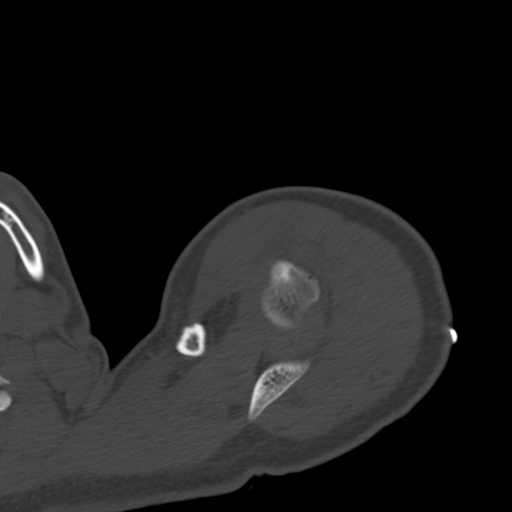
[im 70/76  bone]
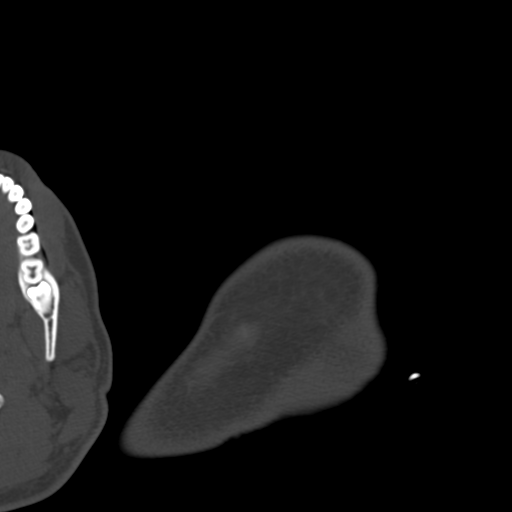

[Series 6: (id) st cor · coronal · 0.29mm/px · 3 of 65 slices shown]
[im 13/65  bone]
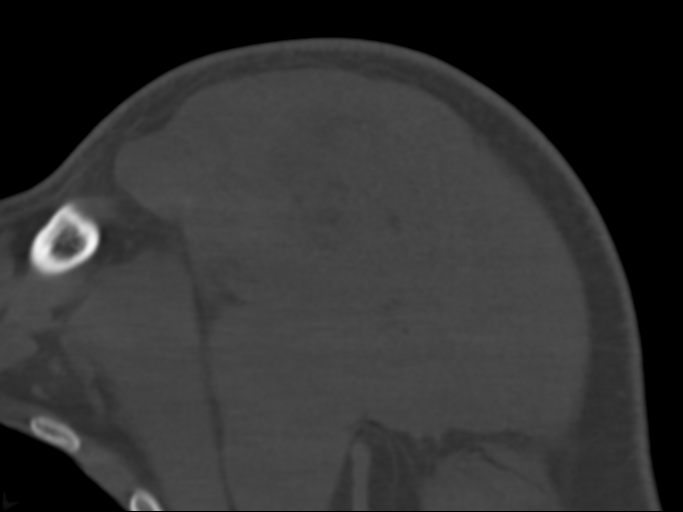
[im 26/65  bone]
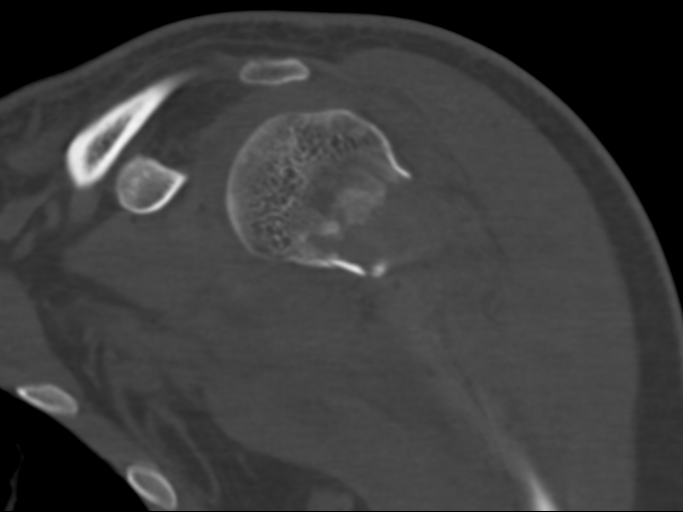
[im 39/65  bone]
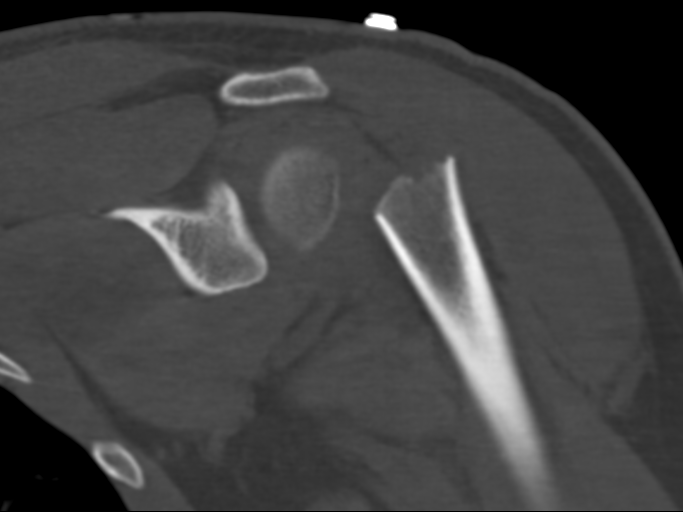

[Series 7: (id) st sag · sagittal · 0.29mm/px · 5 of 103 slices shown, 6 images]
[im 35/103  bone]
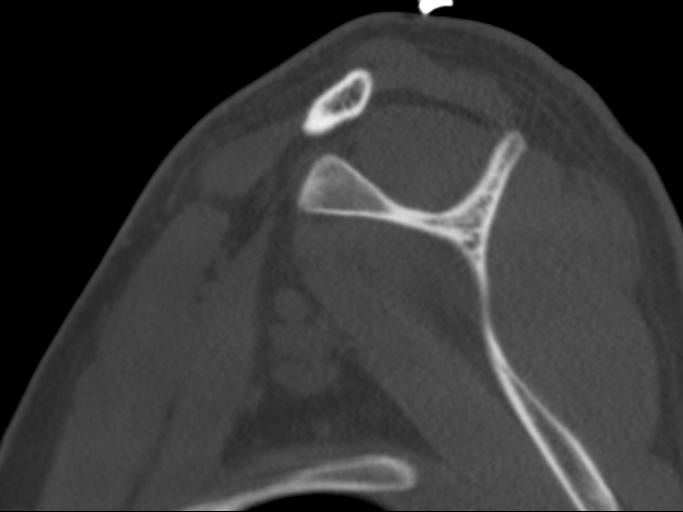
[im 43/103  bone]
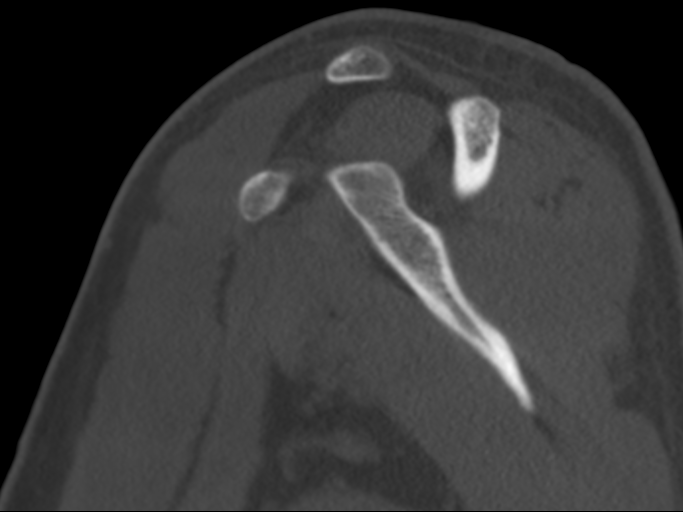
[im 52/103  soft-tissue]
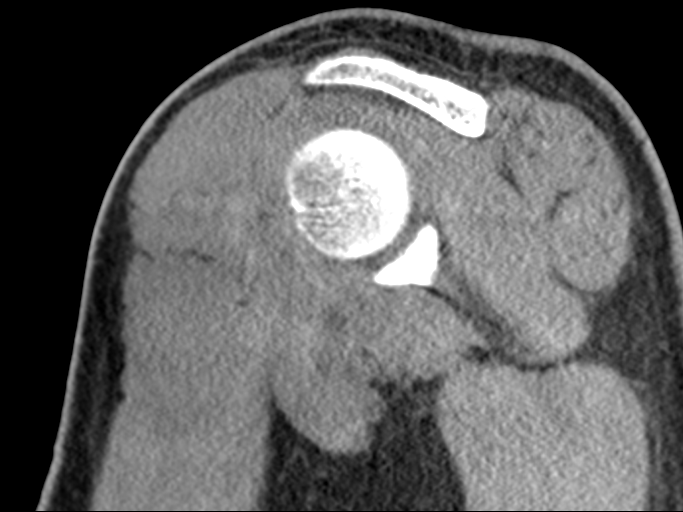
[im 52/103  bone]
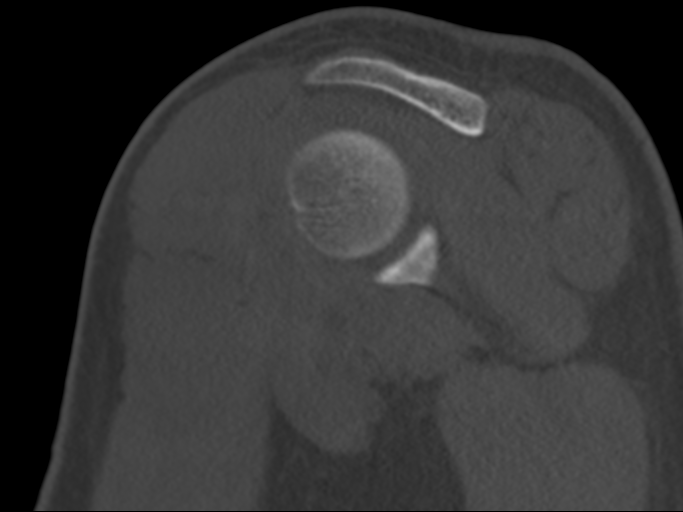
[im 60/103  bone]
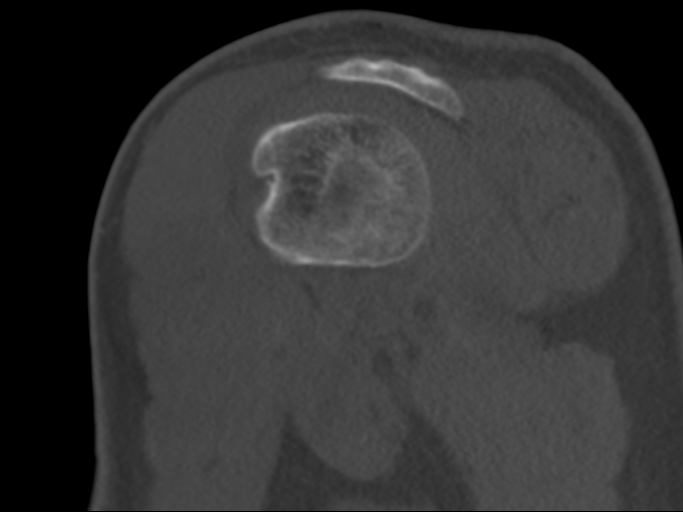
[im 69/103  bone]
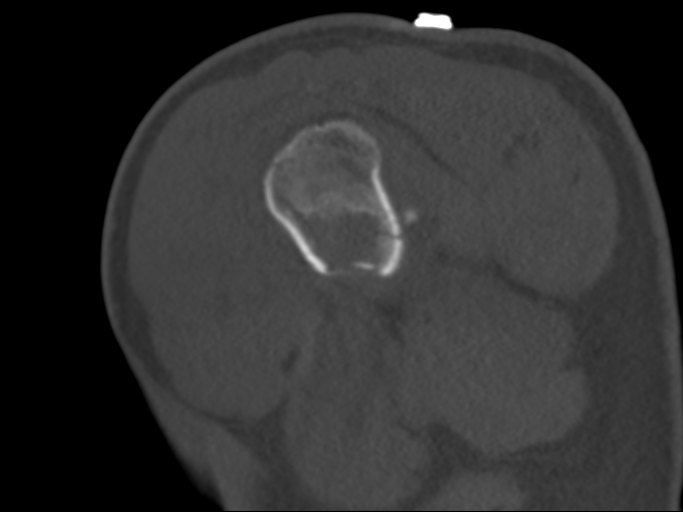

[14 of 33 positions shown; findings below may reference images not displayed]

FINDINGS: Bones/Joint/Cartilage

Acute, closed, comminuted fracture involving the surgical neck of
the left humerus with nearly 1 shaft with posterior displacement and
volar angulation of the distal fracture fragment. No malalignment of
the glenohumeral or AC joints.

Ligaments

Suboptimally assessed by CT.

Muscles and Tendons

No intramuscular hemorrhage.

Soft tissues

Soft tissue edema overlies the fracture. The adjacent ribs and lung
are unremarkable.
IMPRESSION: Comminuted, displaced and volar angulated closed fracture of the
proximal left humerus involving the surgical neck with greater than
1 cm displacement consistent with a Neer category 2 part fracture.
No shoulder dislocation. AC joint is maintained.
